# Patient Record
Sex: Female | Born: 1945 | Race: White | Hispanic: No | Smoking: Never smoker
Health system: Southern US, Community
[De-identification: ages and names within clinical notes are randomized; demographics above are authoritative.]

## PROBLEM LIST (undated history)

## (undated) DIAGNOSIS — G5600 Carpal tunnel syndrome, unspecified upper limb: Secondary | ICD-10-CM

## (undated) DIAGNOSIS — F419 Anxiety disorder, unspecified: Secondary | ICD-10-CM

## (undated) DIAGNOSIS — K449 Diaphragmatic hernia without obstruction or gangrene: Secondary | ICD-10-CM

## (undated) DIAGNOSIS — M79604 Pain in right leg: Secondary | ICD-10-CM

## (undated) DIAGNOSIS — K224 Dyskinesia of esophagus: Secondary | ICD-10-CM

## (undated) DIAGNOSIS — I10 Essential (primary) hypertension: Secondary | ICD-10-CM

## (undated) HISTORY — DX: Pain in right leg: M79.604

## (undated) HISTORY — DX: Diaphragmatic hernia without obstruction or gangrene: K44.9

## (undated) HISTORY — DX: Essential (primary) hypertension: I10

## (undated) HISTORY — DX: Anxiety disorder, unspecified: F41.9

## (undated) HISTORY — DX: Dyskinesia of esophagus: K22.4

## (undated) HISTORY — PX: CARPAL TUNNEL RELEASE: SHX101

## (undated) HISTORY — PX: HERNIA REPAIR: SHX51

## (undated) HISTORY — DX: Carpal tunnel syndrome, unspecified upper limb: G56.00

## (undated) HISTORY — PX: ABDOMINAL HYSTERECTOMY: SHX81

---

## 2017-11-04 DIAGNOSIS — I10 Essential (primary) hypertension: Secondary | ICD-10-CM | POA: Diagnosis not present

## 2017-11-04 DIAGNOSIS — L820 Inflamed seborrheic keratosis: Secondary | ICD-10-CM | POA: Diagnosis not present

## 2017-11-04 DIAGNOSIS — R946 Abnormal results of thyroid function studies: Secondary | ICD-10-CM | POA: Diagnosis not present

## 2017-12-14 DIAGNOSIS — Z1231 Encounter for screening mammogram for malignant neoplasm of breast: Secondary | ICD-10-CM | POA: Diagnosis not present

## 2018-03-09 DIAGNOSIS — R109 Unspecified abdominal pain: Secondary | ICD-10-CM | POA: Diagnosis not present

## 2018-03-09 DIAGNOSIS — R195 Other fecal abnormalities: Secondary | ICD-10-CM | POA: Diagnosis not present

## 2018-03-09 DIAGNOSIS — Z23 Encounter for immunization: Secondary | ICD-10-CM | POA: Diagnosis not present

## 2018-03-09 DIAGNOSIS — I10 Essential (primary) hypertension: Secondary | ICD-10-CM | POA: Diagnosis not present

## 2018-03-10 DIAGNOSIS — R195 Other fecal abnormalities: Secondary | ICD-10-CM | POA: Diagnosis not present

## 2018-03-11 DIAGNOSIS — R1032 Left lower quadrant pain: Secondary | ICD-10-CM | POA: Diagnosis not present

## 2018-03-22 DIAGNOSIS — J029 Acute pharyngitis, unspecified: Secondary | ICD-10-CM | POA: Diagnosis not present

## 2018-03-23 DIAGNOSIS — I10 Essential (primary) hypertension: Secondary | ICD-10-CM | POA: Diagnosis not present

## 2018-03-23 DIAGNOSIS — K5730 Diverticulosis of large intestine without perforation or abscess without bleeding: Secondary | ICD-10-CM | POA: Diagnosis not present

## 2018-03-23 DIAGNOSIS — J029 Acute pharyngitis, unspecified: Secondary | ICD-10-CM | POA: Diagnosis not present

## 2018-03-23 DIAGNOSIS — R6889 Other general symptoms and signs: Secondary | ICD-10-CM | POA: Diagnosis not present

## 2018-04-12 DIAGNOSIS — J069 Acute upper respiratory infection, unspecified: Secondary | ICD-10-CM | POA: Diagnosis not present

## 2018-06-03 DIAGNOSIS — S61211A Laceration without foreign body of left index finger without damage to nail, initial encounter: Secondary | ICD-10-CM | POA: Diagnosis not present

## 2019-01-18 DIAGNOSIS — Z13820 Encounter for screening for osteoporosis: Secondary | ICD-10-CM | POA: Diagnosis not present

## 2019-01-18 DIAGNOSIS — Z0000 Encounter for general adult medical examination without abnormal findings: Secondary | ICD-10-CM | POA: Diagnosis not present

## 2019-02-27 DIAGNOSIS — Z13820 Encounter for screening for osteoporosis: Secondary | ICD-10-CM | POA: Diagnosis not present

## 2019-02-27 DIAGNOSIS — Z1231 Encounter for screening mammogram for malignant neoplasm of breast: Secondary | ICD-10-CM | POA: Diagnosis not present

## 2019-04-28 DIAGNOSIS — R0981 Nasal congestion: Secondary | ICD-10-CM | POA: Diagnosis not present

## 2019-04-28 DIAGNOSIS — J029 Acute pharyngitis, unspecified: Secondary | ICD-10-CM | POA: Diagnosis not present

## 2019-04-28 DIAGNOSIS — Z20822 Contact with and (suspected) exposure to covid-19: Secondary | ICD-10-CM | POA: Diagnosis not present

## 2019-04-28 DIAGNOSIS — R519 Headache, unspecified: Secondary | ICD-10-CM | POA: Diagnosis not present

## 2019-05-25 DIAGNOSIS — Z20828 Contact with and (suspected) exposure to other viral communicable diseases: Secondary | ICD-10-CM | POA: Diagnosis not present

## 2019-05-25 DIAGNOSIS — Z03818 Encounter for observation for suspected exposure to other biological agents ruled out: Secondary | ICD-10-CM | POA: Diagnosis not present

## 2019-06-27 DIAGNOSIS — Z03818 Encounter for observation for suspected exposure to other biological agents ruled out: Secondary | ICD-10-CM | POA: Diagnosis not present

## 2019-06-27 DIAGNOSIS — Z20828 Contact with and (suspected) exposure to other viral communicable diseases: Secondary | ICD-10-CM | POA: Diagnosis not present

## 2019-07-03 DIAGNOSIS — M9904 Segmental and somatic dysfunction of sacral region: Secondary | ICD-10-CM | POA: Diagnosis not present

## 2019-07-03 DIAGNOSIS — M5442 Lumbago with sciatica, left side: Secondary | ICD-10-CM | POA: Diagnosis not present

## 2019-07-03 DIAGNOSIS — M9903 Segmental and somatic dysfunction of lumbar region: Secondary | ICD-10-CM | POA: Diagnosis not present

## 2019-07-03 DIAGNOSIS — M9902 Segmental and somatic dysfunction of thoracic region: Secondary | ICD-10-CM | POA: Diagnosis not present

## 2019-07-04 DIAGNOSIS — M9902 Segmental and somatic dysfunction of thoracic region: Secondary | ICD-10-CM | POA: Diagnosis not present

## 2019-07-04 DIAGNOSIS — M5442 Lumbago with sciatica, left side: Secondary | ICD-10-CM | POA: Diagnosis not present

## 2019-07-04 DIAGNOSIS — M9903 Segmental and somatic dysfunction of lumbar region: Secondary | ICD-10-CM | POA: Diagnosis not present

## 2019-07-04 DIAGNOSIS — M9904 Segmental and somatic dysfunction of sacral region: Secondary | ICD-10-CM | POA: Diagnosis not present

## 2019-07-06 DIAGNOSIS — M5442 Lumbago with sciatica, left side: Secondary | ICD-10-CM | POA: Diagnosis not present

## 2019-07-06 DIAGNOSIS — M9902 Segmental and somatic dysfunction of thoracic region: Secondary | ICD-10-CM | POA: Diagnosis not present

## 2019-07-06 DIAGNOSIS — M9903 Segmental and somatic dysfunction of lumbar region: Secondary | ICD-10-CM | POA: Diagnosis not present

## 2019-07-06 DIAGNOSIS — M9904 Segmental and somatic dysfunction of sacral region: Secondary | ICD-10-CM | POA: Diagnosis not present

## 2019-07-12 DIAGNOSIS — M5442 Lumbago with sciatica, left side: Secondary | ICD-10-CM | POA: Diagnosis not present

## 2019-07-12 DIAGNOSIS — M9903 Segmental and somatic dysfunction of lumbar region: Secondary | ICD-10-CM | POA: Diagnosis not present

## 2019-07-12 DIAGNOSIS — M9902 Segmental and somatic dysfunction of thoracic region: Secondary | ICD-10-CM | POA: Diagnosis not present

## 2019-07-12 DIAGNOSIS — M9904 Segmental and somatic dysfunction of sacral region: Secondary | ICD-10-CM | POA: Diagnosis not present

## 2019-07-21 DIAGNOSIS — M9903 Segmental and somatic dysfunction of lumbar region: Secondary | ICD-10-CM | POA: Diagnosis not present

## 2019-07-21 DIAGNOSIS — M9904 Segmental and somatic dysfunction of sacral region: Secondary | ICD-10-CM | POA: Diagnosis not present

## 2019-07-21 DIAGNOSIS — M9902 Segmental and somatic dysfunction of thoracic region: Secondary | ICD-10-CM | POA: Diagnosis not present

## 2019-07-21 DIAGNOSIS — M5442 Lumbago with sciatica, left side: Secondary | ICD-10-CM | POA: Diagnosis not present

## 2019-07-26 DIAGNOSIS — M9904 Segmental and somatic dysfunction of sacral region: Secondary | ICD-10-CM | POA: Diagnosis not present

## 2019-07-26 DIAGNOSIS — M9902 Segmental and somatic dysfunction of thoracic region: Secondary | ICD-10-CM | POA: Diagnosis not present

## 2019-07-26 DIAGNOSIS — M9903 Segmental and somatic dysfunction of lumbar region: Secondary | ICD-10-CM | POA: Diagnosis not present

## 2019-07-26 DIAGNOSIS — M5442 Lumbago with sciatica, left side: Secondary | ICD-10-CM | POA: Diagnosis not present

## 2019-07-28 DIAGNOSIS — S99921A Unspecified injury of right foot, initial encounter: Secondary | ICD-10-CM | POA: Diagnosis not present

## 2019-07-28 DIAGNOSIS — M25561 Pain in right knee: Secondary | ICD-10-CM | POA: Diagnosis not present

## 2019-07-28 DIAGNOSIS — M25571 Pain in right ankle and joints of right foot: Secondary | ICD-10-CM | POA: Diagnosis not present

## 2019-07-28 DIAGNOSIS — M79661 Pain in right lower leg: Secondary | ICD-10-CM | POA: Diagnosis not present

## 2019-07-28 DIAGNOSIS — S82831A Other fracture of upper and lower end of right fibula, initial encounter for closed fracture: Secondary | ICD-10-CM | POA: Diagnosis not present

## 2019-07-28 DIAGNOSIS — S93401A Sprain of unspecified ligament of right ankle, initial encounter: Secondary | ICD-10-CM | POA: Diagnosis not present

## 2019-07-28 DIAGNOSIS — S99911A Unspecified injury of right ankle, initial encounter: Secondary | ICD-10-CM | POA: Diagnosis not present

## 2019-08-03 DIAGNOSIS — M25571 Pain in right ankle and joints of right foot: Secondary | ICD-10-CM | POA: Diagnosis not present

## 2019-08-03 DIAGNOSIS — M79604 Pain in right leg: Secondary | ICD-10-CM | POA: Diagnosis not present

## 2019-08-17 DIAGNOSIS — M25571 Pain in right ankle and joints of right foot: Secondary | ICD-10-CM | POA: Diagnosis not present

## 2019-08-17 DIAGNOSIS — M25551 Pain in right hip: Secondary | ICD-10-CM | POA: Diagnosis not present

## 2019-08-31 DIAGNOSIS — M25571 Pain in right ankle and joints of right foot: Secondary | ICD-10-CM | POA: Diagnosis not present

## 2019-08-31 DIAGNOSIS — M79604 Pain in right leg: Secondary | ICD-10-CM | POA: Diagnosis not present

## 2019-09-01 ENCOUNTER — Ambulatory Visit: Payer: BC Managed Care – PPO | Attending: Sports Medicine | Admitting: Physical Therapy

## 2019-09-01 ENCOUNTER — Encounter: Payer: Self-pay | Admitting: Physical Therapy

## 2019-09-01 ENCOUNTER — Other Ambulatory Visit: Payer: Self-pay

## 2019-09-01 DIAGNOSIS — M5417 Radiculopathy, lumbosacral region: Secondary | ICD-10-CM | POA: Diagnosis not present

## 2019-09-01 DIAGNOSIS — M6281 Muscle weakness (generalized): Secondary | ICD-10-CM | POA: Diagnosis not present

## 2019-09-01 DIAGNOSIS — M79604 Pain in right leg: Secondary | ICD-10-CM

## 2019-09-01 NOTE — Therapy (Signed)
[facility] [address] [place], [place], [address] Phone: [phone]   Fax:  [phone]  Physical Therapy Evaluation  Patient Details  Name: Katrina Johnson MRN: [id number] Date of Birth: 12/12/1945 Referring Provider (PT): Rodolph Bong, MD   Encounter Date: 09/01/2019   PT End of Session - 09/01/19 1241    Visit Number 1    Number of Visits 8    Date for PT Re-Evaluation 10/06/19    Authorization Type Progress note every 10th visit    PT Start Time 1046   late arrival   PT Stop Time 1115    PT Time Calculation (min) 29 min    Activity Tolerance Patient limited by pain    Behavior During Therapy [facility] for tasks assessed/performed           History reviewed. No pertinent past medical history.  History reviewed. No pertinent surgical history.  There were no vitals filed for this visit.    Subjective Assessment - 09/01/19 1232    Subjective COVID-19 screening performed upon arrival.Patient arrives to physical therapy with reports of right LE pain, neurological symptoms of burning and numbness to right foot/toes, and difficulty walking that began about May 2021. Patient reports sustaining a broken right ankle in June 2021 in which she has just weaned out of the CAM boot. Patient reports sitting increases her pain. Due to pain, numbness, tingling, burning sensation, walking and standing has been difficult and she has fallen at least 5 times in the past 6 months. Patient reports ability to perform basic ADLs but with pain. Pain at worst is rated at 10/10 and pain at best as 0/10. Patient's goals are to decrease pain, improve movement, improve strength, improve ability to perform ADLs and work activities, and feel more stable with walking.    Pertinent History HTN    Limitations Sitting;Lifting;Standing;House hold activities    How long can you sit comfortably? short periods    How long can you stand comfortably? short periods    How long  can you walk comfortably? short periods    Diagnostic tests x-ray & MRI    Patient Stated Goals "more stable walking"    Currently in Pain? Yes    Pain Score 8     Pain Location Leg    Pain Orientation Right    Pain Descriptors / Indicators Burning    Pain Type Chronic pain    Pain Radiating Towards R glute to R foot    Pain Onset More than a month ago    Pain Frequency Intermittent    Aggravating Factors  sitting, walking, standing    Pain Relieving Factors Gabapentin    Effect of Pain on Daily Activities pain with ADLs, sitting, and walking              [facility] PT Assessment - 09/01/19 0001      Assessment   Medical Diagnosis Pain in right leg    Referring Provider (PT) Rodolph Bong, MD    Onset Date/Surgical Date --   May 2021   Next MD Visit to be scheduled    Prior Therapy no      Precautions   Precautions Fall      Restrictions   Weight Bearing Restrictions No      Balance Screen   Has the patient fallen in the past 6 months Yes    How many times? 5    Has the patient had a decrease in activity level because  of a fear of falling?  Yes    Is the patient reluctant to leave their home because of a fear of falling?  No      Home Environment   Living Environment Private residence    Living Arrangements Other relatives    Home Access Stairs to enter    Entrance Stairs-Number of Steps 3    Entrance Stairs-Rails Can reach both      Prior Function   Level of Independence Independent with basic ADLs      Posture/Postural Control   Posture/Postural Control Postural limitations    Postural Limitations Rounded Shoulders;Forward head;Increased lumbar lordosis;Increased thoracic kyphosis;Flexed trunk;Weight shift left      Deep Tendon Reflexes   DTR Assessment Site Patella;Achilles    Patella DTR 2+   2+ bilaterall   Achilles DTR 1+   2+ left     ROM / Strength   AROM / PROM / Strength AROM;Strength      AROM   AROM Assessment Site Lumbar    Lumbar Flexion 5"  finger tip to floor    Lumbar - Right Side Bend 18" fingertip to floor    Lumbar - Left Side Bend 18" fingertip to floor      Strength   Strength Assessment Site Hip;Knee    Right/Left Hip Right;Left    Right Hip Flexion 4-/5    Right Hip Extension 2+/5    Right Hip ABduction 3/5    Left Hip Flexion 4-/5    Left Hip Extension 3+/5    Left Hip ABduction 3+/5    Right/Left Knee Right;Left    Right Knee Flexion 3+/5    Right Knee Extension 4-/5    Left Knee Flexion 4/5    Left Knee Extension 4+/5      Palpation   Palpation comment very tender to R lumbar paraspinals, QL, glutes, down to calf with reports of burning & tenderness      Special Tests    Special Tests Lumbar    Lumbar Tests Slump Test;Straight Leg Raise      Slump test   Findings Positive    Side Right      Straight Leg Raise   Findings Positive    Side  Right      Transfers   Comments use of momentum to come to sitting from supine      Ambulation/Gait   Gait Pattern Step-through pattern;Right steppage;Decreased step length - left;Decreased stance time - right;Decreased stride length;Decreased hip/knee flexion - right;Antalgic;Wide base of support;Decreased weight shift to right   R foot drop into inversion                     Objective measurements completed on examination: See above findings.                 PT Short Term Goals - 09/01/19 1242      PT SHORT TERM GOAL #1   Title STG=LTG             PT Long Term Goals - 09/01/19 1242      PT LONG TERM GOAL #1   Title Patient will be independent with HEP    Time 4    Period Weeks    Status New      PT LONG TERM GOAL #2   Title Patient will report ability to perform ADLs and work activities with right leg pain less than or equal to 4/10.    Time  4    Period Weeks    Status New      PT LONG TERM GOAL #3   Title Patient will report ability to walk community distances with right leg pain less than or equal to 4/10.     Time 4    Period Weeks    Status New      PT LONG TERM GOAL #4   Title Patient will report a centralization of neurological symptoms to knee or higher to denote decrease nerve irritation.    Time 4    Period Weeks    Status New                  Plan - 09/01/19 1311    Clinical Impression Statement Patient is a 74 year old female who presents to physical therapy with acute low back and right LE pain, decreased right LE MMT, and difficulty walking that began in May 2021. R Achilles DTR diminished. Patient very tender in right low back and glute musculature, down to calf upon palpation. (+) R slump test and (+) SLR with reproduction of symptoms. Patient ambulates with a step through pattern but with notable R foot drop into inversion during swing. Patient and PT discussed plan of care and discussed establishing HEP next visit due to late arrival. Patient would benefit from skilled physical therapy to address deficits and goals.    Personal Factors and Comorbidities Comorbidity 1    Comorbidities HTN    Examination-Activity Limitations Locomotion Level;Transfers;Stand;Stairs;Sit    Examination-Participation Restrictions Driving    Stability/Clinical Decision Making Evolving/Moderate complexity    Clinical Decision Making Moderate    Rehab Potential Fair    PT Frequency 2x / week    PT Duration 4 weeks    PT Treatment/Interventions ADLs/Self Care Home Management;Cryotherapy;Electrical Stimulation;Gait training;Stair training;Functional mobility training;Therapeutic activities;Therapeutic exercise;Balance training;Neuromuscular re-education;Manual techniques;Passive range of motion;Patient/family education;Ultrasound    PT Next Visit Plan nustep, gait training, LE strengthening, nerve glides; STW/M to low back glutes to decrease pain, modalties PRN for pain relief    PT Home Exercise Plan to be established next visit    Consulted and Agree with Plan of Care Patient           Patient  will benefit from skilled therapeutic intervention in order to improve the following deficits and impairments:  Abnormal gait, Decreased range of motion, Difficulty walking, Pain, Postural dysfunction, Decreased strength, Decreased balance, Decreased activity tolerance, Decreased mobility  Visit Diagnosis: Pain in right leg - Plan: PT plan of care cert/re-cert  Muscle weakness (generalized) - Plan: PT plan of care cert/re-cert  Radiculopathy, lumbosacral region - Plan: PT plan of care cert/re-cert     Problem List There are no problems to display for this patient.   Guss Bunde, PT, DPT 09/01/2019, 1:18 PM  [facility] [address] [place], [place], [address] Phone: [phone]   Fax:  [phone]  Name: Katrina Johnson MRN: [id number] Date of Birth: November 30, 1945

## 2019-09-05 ENCOUNTER — Encounter: Payer: Self-pay | Admitting: Physical Therapy

## 2019-09-05 ENCOUNTER — Other Ambulatory Visit: Payer: Self-pay

## 2019-09-05 ENCOUNTER — Ambulatory Visit: Payer: BC Managed Care – PPO | Admitting: Physical Therapy

## 2019-09-05 DIAGNOSIS — M5417 Radiculopathy, lumbosacral region: Secondary | ICD-10-CM

## 2019-09-05 DIAGNOSIS — M79604 Pain in right leg: Secondary | ICD-10-CM

## 2019-09-05 DIAGNOSIS — M6281 Muscle weakness (generalized): Secondary | ICD-10-CM

## 2019-09-05 NOTE — Therapy (Signed)
[facility] [address] [place], [place], [address] Phone: [phone]   Fax:  [phone]  Physical Therapy Treatment  Patient Details  Name: Katrina Johnson MRN: [id number] Date of Birth: 08/16/1945 Referring Provider (PT): Rodolph Bong, MD   Encounter Date: 09/05/2019   PT End of Session - 09/05/19 0958    Visit Number 2    Number of Visits 8    Date for PT Re-Evaluation 10/06/19    Authorization Type Progress note every 10th visit    PT Start Time 0945    PT Stop Time 1030    PT Time Calculation (min) 45 min    Activity Tolerance Patient limited by pain    Behavior During Therapy [facility] for tasks assessed/performed           History reviewed. No pertinent past medical history.  History reviewed. No pertinent surgical history.  There were no vitals filed for this visit.   Subjective Assessment - 09/05/19 0955    Subjective Covid-19 screen performed upon arrival. Pt reporting 8/10 pain in R LE with burning and numbness sensations. Pt reporting difficulty sleeping at night and unable to go back to sleep.    Pertinent History HTN    Limitations Sitting;Lifting;Standing;House hold activities    How long can you sit comfortably? short periods    How long can you stand comfortably? short periods    How long can you walk comfortably? short periods    Diagnostic tests x-ray & MRI    Patient Stated Goals "more stable walking"    Currently in Pain? Yes    Pain Score 8     Pain Location Leg    Pain Orientation Right    Pain Descriptors / Indicators Aching;Burning;Numbness    Pain Type Chronic pain    Pain Onset More than a month ago    Pain Frequency Intermittent                             [facility] Adult PT Treatment/Exercise - 09/05/19 0001      Exercises   Exercises Lumbar      Lumbar Exercises: Stretches   Active Hamstring Stretch Right;2 reps;30 seconds    Single Knee to Chest Stretch 2 reps;30 seconds       Lumbar Exercises: Supine   Ab Set 10 reps;3 seconds    Clam 10 reps;3 seconds;Limitations    Straight Leg Raise 10 reps;2 seconds    Other Supine Lumbar Exercises supine marching with core activation      Modalities   Modalities Electrical Stimulation      Electrical Stimulation   Electrical Stimulation Location lumbar spine    Electrical Stimulation Action IFC    Electrical Stimulation Parameters 80-150 Hz x 10 minutes    Electrical Stimulation Goals Pain      Manual Therapy   Manual therapy comments R LE long axis distraction x 3 holding 30 seconds each                  PT Education - 09/05/19 1032    Education Details PT POC, HEP    Person(s) Educated Patient    Methods Explanation;Demonstration;Handout    Comprehension Verbalized understanding;Returned demonstration            PT Short Term Goals - 09/01/19 1242      PT SHORT TERM GOAL #1   Title STG=LTG  PT Long Term Goals - 09/05/19 1001      PT LONG TERM GOAL #1   Title Patient will be independent with HEP    Time 4    Period Weeks    Status On-going      PT LONG TERM GOAL #2   Title Patient will report ability to perform ADLs and work activities with right leg pain less than or equal to 4/10.    Status On-going      PT LONG TERM GOAL #3   Title Patient will report ability to walk community distances with right leg pain less than or equal to 4/10.    Status On-going      PT LONG TERM GOAL #4   Title Patient will report a centralization of neurological symptoms to knee or higher to denote decrease nerve irritation.    Status On-going                 Plan - 09/05/19 1013    Clinical Impression Statement Pt tolerating exericses well. Pt reporting 8/10 pain down R LE. Pt with episodes of shooting pains into R foot/toes during session. Pt was instructed in HEP and introduced to extension exercises where pt reporting no change to symptoms. Continue to progress amb, we  discussed extension exericse progression to moniter response as well as manual and mechanical traction. Continue skilled PT.    Personal Factors and Comorbidities Comorbidity 1    Comorbidities HTN    Examination-Activity Limitations Locomotion Level;Transfers;Stand;Stairs;Sit    Examination-Participation Restrictions Driving    Stability/Clinical Decision Making Evolving/Moderate complexity    Rehab Potential Fair    PT Frequency 2x / week    PT Duration 4 weeks    PT Treatment/Interventions ADLs/Self Care Home Management;Cryotherapy;Electrical Stimulation;Gait training;Stair training;Functional mobility training;Therapeutic activities;Therapeutic exercise;Balance training;Neuromuscular re-education;Manual techniques;Passive range of motion;Patient/family education;Ultrasound    PT Next Visit Plan nustep, gait training, LE strengthening, nerve glides; STW/M to low back glutes to decrease pain, modalties PRN for pain relief    PT Home Exercise Plan to be established next visit    Consulted and Agree with Plan of Care Patient           Patient will benefit from skilled therapeutic intervention in order to improve the following deficits and impairments:  Abnormal gait, Decreased range of motion, Difficulty walking, Pain, Postural dysfunction, Decreased strength, Decreased balance, Decreased activity tolerance, Decreased mobility  Visit Diagnosis: Pain in right leg  Muscle weakness (generalized)  Radiculopathy, lumbosacral region     Problem List There are no problems to display for this patient.   Sharmon Leyden PT, MPT 09/05/2019, 10:32 AM  [facility] [address] [place], [place], [address] Phone: [phone]   Fax:  [phone]  Name: Katrina Johnson MRN: [id number] Date of Birth: 24-Jun-1945

## 2019-09-05 NOTE — Patient Instructions (Signed)
Access Code: [email or web address] URL: [email or web address] Date: 09/05/2019 Prepared by: Narda Amber  Exercises Hooklying Hamstring Stretch with Strap - 2 x daily - 7 x weekly - 3-5 reps - 30 seconds hold Hooklying Single Knee to Chest Stretch - 2 x daily - 7 x weekly - 3-5 reps - 30 seconds hold Seated Thoracic Lumbar Extension - 2 x daily - 7 x weekly - 5 reps - 5-10 seconds hold Supine March - 2 x daily - 7 x weekly - 10 reps - 2 seconds hold

## 2019-09-06 ENCOUNTER — Ambulatory Visit: Payer: BC Managed Care – PPO | Admitting: Physical Therapy

## 2019-09-07 ENCOUNTER — Other Ambulatory Visit: Payer: Self-pay

## 2019-09-07 ENCOUNTER — Ambulatory Visit: Payer: BC Managed Care – PPO | Admitting: Physical Therapy

## 2019-09-07 DIAGNOSIS — M5417 Radiculopathy, lumbosacral region: Secondary | ICD-10-CM | POA: Diagnosis not present

## 2019-09-07 DIAGNOSIS — M79604 Pain in right leg: Secondary | ICD-10-CM

## 2019-09-07 DIAGNOSIS — M6281 Muscle weakness (generalized): Secondary | ICD-10-CM | POA: Diagnosis not present

## 2019-09-07 NOTE — Therapy (Addendum)
[facility] [address] [place], [place], [address] Phone: [phone]   Fax:  [phone]  Physical Therapy Treatment PHYSICAL THERAPY DISCHARGE SUMMARY  Visits from Start of Care: 3  Current functional level related to goals / functional outcomes: See below   Remaining deficits: See goals   Education / Equipment: HEP Plan: Patient agrees to discharge.  Patient goals were not met. Patient is being discharged due to not returning since the last visit.  ?????  Guss Bunde, PT, DPT 04/12/20   Patient Details  Name: Katrina Johnson MRN: [id number] Date of Birth: 1945/04/08 Referring Provider (PT): Rodolph Bong, MD   Encounter Date: 09/07/2019   PT End of Session - 09/07/19 0948    Visit Number 3    Number of Visits 8    Date for PT Re-Evaluation 10/06/19    Authorization Type Progress note every 10th visit    PT Start Time 0945    PT Stop Time 1028    PT Time Calculation (min) 43 min    Activity Tolerance Patient limited by pain    Behavior During Therapy [facility] for tasks assessed/performed           No past medical history on file.  No past surgical history on file.  There were no vitals filed for this visit.   Subjective Assessment - 09/07/19 0948    Subjective Covid-19 screen performed upon arrival. Pt reporting 8/10 pain in R LE with burning and numbness sensations. Pt reporting difficulty sleeping at night and unable to go back to sleep.    Pertinent History HTN    Limitations Sitting;Lifting;Standing;House hold activities    How long can you sit comfortably? short periods    How long can you stand comfortably? short periods    How long can you walk comfortably? short periods    Diagnostic tests x-ray & MRI    Patient Stated Goals "more stable walking"    Currently in Pain? Yes    Pain Score 3     Pain Location Leg    Pain Orientation Right    Pain Descriptors / Indicators Aching;Burning;Numbness    Pain  Type Chronic pain    Pain Onset More than a month ago    Pain Frequency Intermittent              [facility] PT Assessment - 09/07/19 0001      Assessment   Medical Diagnosis Pain in right leg    Referring Provider (PT) Rodolph Bong, MD    Next MD Visit to be scheduled    Prior Therapy no      Precautions   Precautions [facility] Adult PT Treatment/Exercise - 09/07/19 0001      Exercises   Exercises Lumbar      Lumbar Exercises: Stretches   Single Knee to Chest Stretch 3 reps;30 seconds    Figure 4 Stretch 3 reps;30 seconds;Supine;Without overpressure      Lumbar Exercises: Aerobic   Nustep level 3 x10 mins UE and LE monitored      Lumbar Exercises: Supine   Ab Set 15 reps;3 seconds    Glut Set 15 reps;3 seconds    Clam 3 seconds;10 reps   x10   Clam Limitations yellow theraband     Straight Leg Raise 10 reps;2 seconds    Other Supine Lumbar  Exercises supine marching with core activation x10 3" hold yellow theraband      Lumbar Exercises: Sidelying   Clam Right;15 reps;2 seconds      Modalities   Modalities Electrical Stimulation      Electrical Stimulation   Electrical Stimulation Location lumbar spine    Electrical Stimulation Action IFC    Electrical Stimulation Parameters 80-150 hz x10 mins    Electrical Stimulation Goals Pain                    PT Short Term Goals - 09/01/19 1242      PT SHORT TERM GOAL #1   Title STG=LTG             PT Long Term Goals - 09/05/19 1001      PT LONG TERM GOAL #1   Title Patient will be independent with HEP    Time 4    Period Weeks    Status On-going      PT LONG TERM GOAL #2   Title Patient will report ability to perform ADLs and work activities with right leg pain less than or equal to 4/10.    Status On-going      PT LONG TERM GOAL #3   Title Patient will report ability to walk community distances with right leg pain less than or equal to 4/10.    Status  On-going      PT LONG TERM GOAL #4   Title Patient will report a centralization of neurological symptoms to knee or higher to denote decrease nerve irritation.    Status On-going                 Plan - 09/07/19 1003    Clinical Impression Statement Patient responded fairly to therapy session with reports of pain in R LE. Upon standing from nustep, patient reported sharp shooting pain in R LE down to her toes. Patient with intermittent reports of pain throughout TEs; rest breaks provided to which pain decreased. Normal response upon removal of modalities.    Personal Factors and Comorbidities Comorbidity 1    Comorbidities HTN    Examination-Activity Limitations Locomotion Level;Transfers;Stand;Stairs;Sit    Examination-Participation Restrictions Driving    Stability/Clinical Decision Making Evolving/Moderate complexity    Clinical Decision Making Moderate    Rehab Potential Fair    PT Frequency 2x / week    PT Duration 4 weeks    PT Treatment/Interventions ADLs/Self Care Home Management;Cryotherapy;Electrical Stimulation;Gait training;Stair training;Functional mobility training;Therapeutic activities;Therapeutic exercise;Balance training;Neuromuscular re-education;Manual techniques;Passive range of motion;Patient/family education;Ultrasound    PT Next Visit Plan nustep, gait training, LE strengthening, nerve glides; STW/M to low back glutes to decrease pain, modalties PRN for pain relief    PT Home Exercise Plan see patient education section    Consulted and Agree with Plan of Care Patient           Patient will benefit from skilled therapeutic intervention in order to improve the following deficits and impairments:  Abnormal gait, Decreased range of motion, Difficulty walking, Pain, Postural dysfunction, Decreased strength, Decreased balance, Decreased activity tolerance, Decreased mobility  Visit Diagnosis: Pain in right leg  Muscle weakness (generalized)  Radiculopathy,  lumbosacral region     Problem List There are no problems to display for this patient.   Gabriela Eves, PT, DPT 09/07/2019, 11:32 AM  [facility] [address] [place], [place], [address] Phone: [phone]   Fax:  [phone]  Name: Katrina Johnson MRN:  [id number] Date of Birth: [birth year]

## 2019-09-11 DIAGNOSIS — M79671 Pain in right foot: Secondary | ICD-10-CM | POA: Diagnosis not present

## 2019-09-11 DIAGNOSIS — M21371 Foot drop, right foot: Secondary | ICD-10-CM | POA: Diagnosis not present

## 2019-09-11 DIAGNOSIS — M25571 Pain in right ankle and joints of right foot: Secondary | ICD-10-CM | POA: Diagnosis not present

## 2019-09-12 ENCOUNTER — Ambulatory Visit: Payer: BC Managed Care – PPO | Admitting: *Deleted

## 2019-09-14 ENCOUNTER — Ambulatory Visit: Payer: BC Managed Care – PPO | Admitting: Physical Therapy

## 2019-09-15 DIAGNOSIS — R531 Weakness: Secondary | ICD-10-CM | POA: Diagnosis not present

## 2019-09-15 DIAGNOSIS — M25551 Pain in right hip: Secondary | ICD-10-CM | POA: Diagnosis not present

## 2019-09-15 DIAGNOSIS — M5431 Sciatica, right side: Secondary | ICD-10-CM | POA: Diagnosis not present

## 2019-09-15 DIAGNOSIS — Z79899 Other long term (current) drug therapy: Secondary | ICD-10-CM | POA: Diagnosis not present

## 2019-09-15 DIAGNOSIS — Z883 Allergy status to other anti-infective agents status: Secondary | ICD-10-CM | POA: Diagnosis not present

## 2019-09-19 ENCOUNTER — Encounter: Payer: BC Managed Care – PPO | Admitting: Physical Therapy

## 2019-09-21 ENCOUNTER — Encounter: Payer: BC Managed Care – PPO | Admitting: Physical Therapy

## 2019-09-25 DIAGNOSIS — M5416 Radiculopathy, lumbar region: Secondary | ICD-10-CM | POA: Diagnosis not present

## 2019-09-26 ENCOUNTER — Encounter: Payer: Self-pay | Admitting: Neurology

## 2019-09-26 ENCOUNTER — Ambulatory Visit: Payer: BC Managed Care – PPO | Admitting: Neurology

## 2019-09-26 ENCOUNTER — Other Ambulatory Visit: Payer: Self-pay

## 2019-09-26 VITALS — BP 136/84 | HR 85 | Ht 62.0 in | Wt 175.0 lb

## 2019-09-26 DIAGNOSIS — M21371 Foot drop, right foot: Secondary | ICD-10-CM | POA: Diagnosis not present

## 2019-09-26 DIAGNOSIS — M5431 Sciatica, right side: Secondary | ICD-10-CM

## 2019-09-26 HISTORY — DX: Foot drop, right foot: M21.371

## 2019-09-26 NOTE — Progress Notes (Signed)
Reason for visit: Right-sided sciatica  Referring physician: Dr. Star Age Want is a 74 y.o. female  History of present illness:  Katrina Johnson is a 74 year old right-handed white female with a history of onset of right-sided sciatica that began in May 2021.  The patient had made a long car drive from [place] to the [place] area, 6 hours in the car.  The patient began having pain in the right buttock going down the right leg to the foot.  She does not recall any weakness initially.  She was seen by chiropractor for this issue.  In mid June 2021, she fell and fractured her right ankle, she did not initially know this and was walking on the foot for a couple weeks.  She was being seen by chiropractor around that time.  Eventually she went to [facility] after an x-ray showed evidence of the fracture of the fibula.  She was placed in a brace, but did not wear the brace properly for the first 2 weeks, when she took the brace off she clearly had evidence of weakness in the right leg and with a prominent foot drop.  The patient has developed significant spasms of the right leg and shooting pain going down the leg from the buttocks to the foot.  She went to the emergency room on 16 September 2019 and she has been placed on hydrocodone, gabapentin, methocarbamol, and she was given 2 courses of prednisone.  The prednisone did help some.  She indicates that she had MRI of the lumbar spine yesterday, but she does not know the results of the study.  The patient has a walker and a cane at home, she continues to fall on occasion.  She denies issues controlling the bladder but she does have some chronic issues with intermittent diarrhea and constipation.  She reports no neck pain or pain down the arms and no pain down the left leg or weakness of the left leg.  She is sent to this office for further evaluation.  Past Medical History:  Diagnosis Date  . Anxiety   . CTS (carpal tunnel syndrome)    right  .  Esophageal spasm   . Hiatal hernia   . Hypertension   . Right leg pain     Past Surgical History:  Procedure Laterality Date  . ABDOMINAL HYSTERECTOMY    . CARPAL TUNNEL RELEASE Right   . HERNIA REPAIR      Family History  Problem Relation Age of Onset  . Heart attack Mother   . Heart disease Father   . Bone cancer Father     Social history:  reports that she has never smoked. She has never used smokeless tobacco. She reports current alcohol use. She reports that she does not use drugs.  Medications:  Prior to Admission medications   Medication Sig Start Date End Date Taking? Authorizing Provider  atenolol (TENORMIN) 100 MG tablet Take 100 mg by mouth daily.   Yes [provider]  DULoxetine (CYMBALTA) 60 MG capsule Take 1 capsule by mouth daily.   Yes [provider]  gabapentin (NEURONTIN) 300 MG capsule Take 300 mg by mouth 3 (three) times daily as needed.   Yes [provider]  HYDROcodone-acetaminophen (NORCO/VICODIN) 5-325 MG tablet hydrocodone 5 mg-acetaminophen 325 mg tablet  Take 1 tablet every 4-6 hours by oral route as needed for pain for 5 days.   Yes [provider]  methocarbamol (ROBAXIN) 500 MG tablet Take 500  mg by mouth every 4 (four) hours as needed for muscle spasms.   Yes [provider]  quinapril (ACCUPRIL) 40 MG tablet Take 40 mg by mouth daily. 03/17/16  Yes [provider]      Allergies  Allergen Reactions  . Erythromycin Nausea And Vomiting    ROS:  Out of a complete 14 system review of symptoms, the patient complains only of the following symptoms, and all other reviewed systems are negative.  Right-sided sciatica, right leg weakness Walking difficulty, falls  Blood pressure 136/84, pulse 85, height 5\' 2"  (1.575 m), weight 175 lb (79.4 kg).  Physical Exam  General: The patient is alert and cooperative at the time of the examination.  The patient is moderately obese.  Eyes: Pupils are  equal, round, and reactive to light. Discs are flat bilaterally.  Neck: The neck is supple, no carotid bruits are noted.  Respiratory: The respiratory examination is clear.  Cardiovascular: The cardiovascular examination reveals a regular rate and rhythm, no obvious murmurs or rubs are noted.  Skin: Extremities are without significant edema.  Neurologic Exam  Mental status: The patient is alert and oriented x 3 at the time of the examination. The patient has apparent normal recent and remote memory, with an apparently normal attention span and concentration ability.  Cranial nerves: Facial symmetry is present. There is good sensation of the face to pinprick and soft touch bilaterally. The strength of the facial muscles and the muscles to head turning and shoulder shrug are normal bilaterally. Speech is well enunciated, no aphasia or dysarthria is noted. Extraocular movements are full. Visual fields are full. The tongue is midline, and the patient has symmetric elevation of the soft palate. No obvious hearing deficits are noted.  Motor: The motor testing reveals 5 over 5 strength of all 4 extremities, with exception of a prominent right foot drop and weakness with plantar flexion of the right foot and severe weakness with inversion and eversion of the right foot and weakness of toe flexion extension of the right foot. Good symmetric motor tone is noted throughout.  Sensory: Sensory testing is intact to pinprick, soft touch, vibration sensation, and position sense on all 4 extremities, with exception of decreased pinprick, vibration sensation, and position sensation of the right foot. No evidence of extinction is noted.  Coordination: Cerebellar testing reveals good finger-nose-finger and heel-to-shin bilaterally.  Gait and station: Gait is somewhat wide-based, prominent steppage gait pattern with the right leg is seen.  Tandem gait was not attempted.  Romberg is negative.  The patient is not  able to walk on the toes or heels on the right foot.  Reflexes: Deep tendon reflexes are symmetric and normal bilaterally, with exception of absence of the right ankle jerk reflex. Toes are downgoing bilaterally.   Assessment/Plan:  1.  Right-sided sciatica, right leg weakness  The patient apparently has already had MRI of the lumbar spine.  The results were not available to me at this time.  If the MRI reveals evidence of L5 and S1 nerve root compression, the patient likely will require surgical decompression.  If the MRI is unrevealing, EMG and nerve conduction study evaluation is more important.  The patient may have a sciatic neuropathy with or without an overlying peroneal neuropathy at the knee.  I will set the patient up for EMG and nerve conduction study, she will have nerve conductions on both legs and EMG on the right leg.  This can be canceled if she requires  lumbar surgery.  Marlan Palau MD 09/26/2019 9:08 AM  [facility] [address] [place], [place] [address]  Phone [phone] Fax [phone]

## 2019-09-29 DIAGNOSIS — M79604 Pain in right leg: Secondary | ICD-10-CM | POA: Diagnosis not present

## 2019-10-09 ENCOUNTER — Telehealth: Payer: Self-pay | Admitting: Neurology

## 2019-10-09 DIAGNOSIS — M7989 Other specified soft tissue disorders: Secondary | ICD-10-CM

## 2019-10-09 NOTE — Telephone Encounter (Signed)
I called the patient.  Within the last week she has had onset of swelling in the right foot and ankle and increased pain, she may have pain with weightbearing.  She is on gabapentin but has swelling on the right foot.  I will check a venous Doppler study, the patient will call her orthopedic doctor to make sure there was not a reinjury to the ankle or foot.

## 2019-10-09 NOTE — Addendum Note (Signed)
Addended by: York Spaniel on: 10/09/2019 04:17 PM   Modules accepted: Orders

## 2019-10-09 NOTE — Telephone Encounter (Signed)
Attempted to call pt, LVM for call back  °

## 2019-10-09 NOTE — Telephone Encounter (Signed)
Pt called feet swollen, having a lot of pain, leg cramping. Pt would like a call from the nurse.

## 2019-10-10 DIAGNOSIS — M79604 Pain in right leg: Secondary | ICD-10-CM | POA: Diagnosis not present

## 2019-10-10 DIAGNOSIS — M79671 Pain in right foot: Secondary | ICD-10-CM | POA: Diagnosis not present

## 2019-10-11 ENCOUNTER — Telehealth: Payer: Self-pay | Admitting: Neurology

## 2019-10-11 ENCOUNTER — Ambulatory Visit (INDEPENDENT_AMBULATORY_CARE_PROVIDER_SITE_OTHER): Payer: BC Managed Care – PPO | Admitting: Neurology

## 2019-10-11 ENCOUNTER — Ambulatory Visit: Payer: BC Managed Care – PPO | Admitting: Neurology

## 2019-10-11 ENCOUNTER — Other Ambulatory Visit: Payer: Self-pay

## 2019-10-11 ENCOUNTER — Encounter: Payer: Self-pay | Admitting: Neurology

## 2019-10-11 DIAGNOSIS — G541 Lumbosacral plexus disorders: Secondary | ICD-10-CM

## 2019-10-11 DIAGNOSIS — M5431 Sciatica, right side: Secondary | ICD-10-CM

## 2019-10-11 HISTORY — DX: Lumbosacral plexus disorders: G54.1

## 2019-10-11 NOTE — Telephone Encounter (Signed)
MRI of the lumbar spine was done through [facility], no clear evidence of L5 or S1 nerve root impingement.   MRI lumbar 09/25/19:  Impression:  1.  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  Mild to moderate narrowing of the subarticular zones and mild to moderate left neuroforaminal stenosis.  Intraspinous bursitis also present.  2.  L5-S1 moderate bilateral facet arthrosis with minimal disc bulge.

## 2019-10-11 NOTE — Progress Notes (Signed)
Please refer to EMG and nerve conduction procedure note.  

## 2019-10-11 NOTE — Procedures (Signed)
     HISTORY:  Katrina Johnson is a 74 year old patient with a history of onset of right foot drop with severe pain in the posterior regions of the thigh and in the leg below the knee.  The patient denies any back pain per se.  She has severe weakness of the leg, she has been evaluated for this issue.  NERVE CONDUCTION STUDIES:  Nerve conduction studies were performed on both lower extremities.  The distal motor latency and motor amplitude for the left peroneal nerve was normal but was absent on the right.  The distal motor latencies for the posterior tibial nerves were normal bilaterally but with a low motor amplitude on the right and normal on the left.  Slowing was seen for the right posterior tibial nerve, normal nerve conduction velocity was seen for the left posterior tibial nerve and left peroneal nerve.  The sural sensory latencies were within normal limits bilaterally but was unobtainable for the right peroneal nerve, normal for the left peroneal nerve.  The F-wave latency for the right posterior tibial nerve was unobtainable, normal for the left posterior tibial nerve.  EMG STUDIES:  EMG study was performed on the right lower extremity:  The tibialis anterior muscle reveals no voluntary motor units. 3+ fibrillations and positive waves were seen. The peroneus tertius muscle reveals no voluntary motor units. 3+ fibrillations and positive waves were seen. The medial gastrocnemius muscle reveals 1 to 3K motor units with slightly decreased recruitment. No fibrillations or positive waves were seen. The vastus lateralis muscle reveals 2 to 4K motor units with full recruitment. No fibrillations or positive waves were seen. The iliopsoas muscle reveals 2 to 4K motor units with full recruitment. No fibrillations or positive waves were seen. The biceps femoris muscle (long head) reveals 2 to 3K motor units with decreased recruitment. 2+ fibrillations and positive waves were seen. The gluteus medius  muscle reveals 1 to 3K motor units with decreased recruitment.  2+ fibrillations and positive waves were seen. The lumbosacral paraspinal muscles were tested at 3 levels, and revealed no abnormalities of insertional activity at all 3 levels tested. There was good relaxation.   IMPRESSION:  Nerve conduction studies done on both lower extremities shows evidence of dysfunction of the right peroneal and posterior tibial nerves.  EMG evaluation of the right lower extremity shows acute denervation that is severe in the peroneal nerve distribution muscles, but acute denervation is also seen in the right hamstring muscles and right gluteus medius muscle.  There is sparing of the lumbosacral paraspinal muscles.  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   Marlan Palau MD 10/11/2019 4:08 PM  [facility] [address] [place], [place] [address]  Phone [phone] Fax [phone]

## 2019-10-11 NOTE — Progress Notes (Addendum)
The patient comes in today for EMG and nerve conduction study evaluation.  The nerve conductions show some abnormalities of the peroneal and posterior tibial nerves, EMG shows denervation in the peroneal nerve distribution, hamstring muscles, and gluteus medius.  The study suggests the possibility of a lumbosacral plexopathy.  MRI of the lumbar spine did not show evidence of L5 or S1 nerve root impingement.  Lumbosacral paraspinal muscles on EMG were within normal limits.  The patient will be sent back for MRI of the pelvis.   The patient reports some swelling in the right leg, venous Doppler study was ordered.  Upon further evaluation, no swelling was seen today, the patient reports dependent swelling and purple discoloration that goes away when she elevates the leg.  I have canceled the venous Doppler study.    MNC    Nerve / Sites Muscle Latency Ref. Amplitude Ref. Rel Amp Segments Distance Velocity Ref. Area    ms ms mV mV %  cm m/s m/s mVms  R Peroneal - EDB     Ankle EDB NR ?6.5 NR ?2.0 NR Ankle - EDB 9   NR     Fib head EDB NR  NR  NR Fib head - Ankle 24 NR ?44 NR  L Peroneal - EDB     Ankle EDB 3.8 ?6.5 4.5 ?2.0 100 Ankle - EDB 9   14.6     Fib head EDB 8.7  4.3  96.2 Fib head - Ankle 25 51 ?44 14.2     Pop fossa EDB 10.6  4.4  102 Pop fossa - Fib head 10 53 ?44 14.2         Pop fossa - Ankle      R Tibial - AH     Ankle AH 3.7 ?5.8 0.3 ?4.0 100 Ankle - AH 9   1.6     Pop fossa AH 13.5  0.1  31.4 Pop fossa - Ankle 33 34 ?41 0.6  L Tibial - AH     Ankle AH 3.4 ?5.8 4.8 ?4.0 100 Ankle - AH 9   10.2     Pop fossa AH 10.3  2.5  52.7 Pop fossa - Ankle 32 46 ?41 12.0             SNC    Nerve / Sites Rec. Site Peak Lat Ref.  Amp Ref. Segments Distance    ms ms V V  cm  R Sural - Ankle (Calf)     Calf Ankle 3.3 ?4.4 6 ?6 Calf - Ankle 14  L Sural - Ankle (Calf)     Calf Ankle 3.4 ?4.4 7 ?6 Calf - Ankle 14  R Superficial peroneal - Ankle     Lat leg Ankle NR ?4.4 NR ?6 Lat  leg - Ankle 14  L Superficial peroneal - Ankle     Lat leg Ankle 3.6 ?4.4 9 ?6 Lat leg - Ankle 14              F  Wave    Nerve F Lat Ref.   ms ms  R Tibial - AH NR ?56.0  L Tibial - AH 44.7 ?56.0

## 2019-10-12 ENCOUNTER — Telehealth: Payer: Self-pay | Admitting: Neurology

## 2019-10-12 DIAGNOSIS — M21371 Foot drop, right foot: Secondary | ICD-10-CM | POA: Diagnosis not present

## 2019-10-12 NOTE — Telephone Encounter (Signed)
[employer] Auth: [id number] (exp. 10/12/19 to 04/08/20) order sent to GI. They will reach out to the patient to schedule.

## 2019-10-14 ENCOUNTER — Ambulatory Visit
Admission: RE | Admit: 2019-10-14 | Discharge: 2019-10-14 | Disposition: A | Discharge status: Home / Self Care | Payer: BC Managed Care – PPO | Source: Ambulatory Visit | Attending: Neurology | Admitting: Neurology

## 2019-10-14 DIAGNOSIS — K5730 Diverticulosis of large intestine without perforation or abscess without bleeding: Secondary | ICD-10-CM | POA: Diagnosis not present

## 2019-10-14 DIAGNOSIS — G541 Lumbosacral plexus disorders: Secondary | ICD-10-CM

## 2019-10-14 DIAGNOSIS — M6258 Muscle wasting and atrophy, not elsewhere classified, other site: Secondary | ICD-10-CM | POA: Diagnosis not present

## 2019-10-14 DIAGNOSIS — M79604 Pain in right leg: Secondary | ICD-10-CM | POA: Diagnosis not present

## 2019-10-14 DIAGNOSIS — M79671 Pain in right foot: Secondary | ICD-10-CM | POA: Diagnosis not present

## 2019-10-23 ENCOUNTER — Encounter: Payer: BC Managed Care – PPO | Admitting: Neurology

## 2019-10-25 ENCOUNTER — Telehealth: Payer: Self-pay | Admitting: Neurology

## 2019-10-25 DIAGNOSIS — G541 Lumbosacral plexus disorders: Secondary | ICD-10-CM

## 2019-10-25 DIAGNOSIS — E538 Deficiency of other specified B group vitamins: Secondary | ICD-10-CM

## 2019-10-25 NOTE — Telephone Encounter (Signed)
Pt called, Having pain shooting down right leg, foot stays swollen. Pt would like a call from the nurse to see if Dr. Anne Hahn can work in this week.

## 2019-10-25 NOTE — Telephone Encounter (Signed)
I called the patient.  She is still having her usual pain down the right leg, EMG has shown evidence of a lumbosacral plexopathy.  MRI of the pelvis was ordered and done on 14 October 2019, it apparently has not yet been read.  We will need to contact [facility] imaging to get the MRI read and the report sent to me.

## 2019-10-26 MED ORDER — PREDNISONE 5 MG PO TABS: ORAL_TABLET | ORAL | 0 refills | Status: AC

## 2019-10-26 MED ORDER — GABAPENTIN 600 MG PO TABS: 600.0000 mg | ORAL_TABLET | Freq: Three times a day (TID) | ORAL | 1 refills | Status: DC

## 2019-10-26 NOTE — Telephone Encounter (Signed)
Called [facility] imaging yesterday, and this morning, still waiting on results to be faxed in, Will call again today, if needed

## 2019-10-26 NOTE — Telephone Encounter (Signed)
Pt has called back and the most recent message at 2:35 was relayed to her.  Pt will wait to be called and asked that it be noted that she is in pain.

## 2019-10-26 NOTE — Addendum Note (Signed)
Addended by: York Spaniel on: 10/26/2019 05:26 PM   Modules accepted: Orders

## 2019-10-26 NOTE — Telephone Encounter (Signed)
Pt called wanting to know the update on these results. Pt states she is in a lot of pain. Please advise.

## 2019-10-26 NOTE — Telephone Encounter (Signed)
Pt has called asking if [facility] has been contacted as of yet.  Pt advised Candace,CMA is with patients but as soon as she is available to do so she will reach out to them.  Pt has asked that it be noted she had to cry all night due to pain.

## 2019-10-26 NOTE — Telephone Encounter (Signed)
Fyi  Called imaging again at [phone])  Tech states she is unaware why imaging has not been read yet  but she changed status to STAT  Results should come in soon   - informed her pt has called twice today for results

## 2019-10-26 NOTE — Telephone Encounter (Signed)
I called the patient.  The MRI of the pelvis has not yet been read, I have called [facility] again to make sure they get one of the radiologist to read this.  I will call in a prescription for prednisone over the next 12 days, the patient will increase the gabapentin to 600 mg 3 times daily.

## 2019-10-27 NOTE — Addendum Note (Signed)
Addended by: York Spaniel on: 10/27/2019 09:22 AM   Modules accepted: Orders

## 2019-10-27 NOTE — Telephone Encounter (Signed)
I called the patient and discussed the MRI result. No compressive lesions, we will get further blood work.

## 2019-10-30 ENCOUNTER — Other Ambulatory Visit: Payer: Self-pay

## 2019-10-30 ENCOUNTER — Other Ambulatory Visit (INDEPENDENT_AMBULATORY_CARE_PROVIDER_SITE_OTHER): Payer: Self-pay

## 2019-10-30 DIAGNOSIS — G541 Lumbosacral plexus disorders: Secondary | ICD-10-CM

## 2019-10-30 DIAGNOSIS — E538 Deficiency of other specified B group vitamins: Secondary | ICD-10-CM | POA: Diagnosis not present

## 2019-10-30 DIAGNOSIS — Z0289 Encounter for other administrative examinations: Secondary | ICD-10-CM

## 2019-11-01 LAB — MULTIPLE MYELOMA PANEL, SERUM
Albumin SerPl Elph-Mcnc: 3.9 g/dL (ref 2.9–4.4)
Albumin/Glob SerPl: 1.4 (ref 0.7–1.7)
Alpha 1: 0.3 g/dL (ref 0.0–0.4)
Alpha2 Glob SerPl Elph-Mcnc: 0.7 g/dL (ref 0.4–1.0)
B-Globulin SerPl Elph-Mcnc: 1.2 g/dL (ref 0.7–1.3)
Gamma Glob SerPl Elph-Mcnc: 0.8 g/dL (ref 0.4–1.8)
Globulin, Total: 3 g/dL (ref 2.2–3.9)
IgA/Immunoglobulin A, Serum: 297 mg/dL (ref 64–422)
IgG (Immunoglobin G), Serum: 873 mg/dL (ref 586–1602)
IgM (Immunoglobulin M), Srm: 84 mg/dL (ref 26–217)
Total Protein: 6.9 g/dL (ref 6.0–8.5)

## 2019-11-01 LAB — HEMOGLOBIN A1C
Est. average glucose Bld gHb Est-mCnc: 108 mg/dL
Hgb A1c MFr Bld: 5.4 % (ref 4.8–5.6)

## 2019-11-01 LAB — PAN-ANCA
ANCA Proteinase 3: <3.5 U/mL (ref 0.0–3.5)
Atypical pANCA: <1:20 {titer}
C-ANCA: <1:20 {titer}
Myeloperoxidase Ab: <9 U/mL (ref 0.0–9.0)
P-ANCA: <1:20 {titer}

## 2019-11-01 LAB — VITAMIN B12: Vitamin B-12: 409 pg/mL (ref 232–1245)

## 2019-11-01 LAB — ANGIOTENSIN CONVERTING ENZYME: Angio Convert Enzyme: 9 U/L — ABNORMAL LOW (ref 14–82)

## 2019-11-01 LAB — ANA W/REFLEX: Anti Nuclear Antibody (ANA): NEGATIVE

## 2019-11-01 LAB — HEPATITIS C ANTIBODY: Hep C Virus Ab: <0.1 s/co ratio (ref 0.0–0.9)

## 2019-11-01 LAB — SEDIMENTATION RATE: Sed Rate: 2 mm/hr (ref 0–40)

## 2019-11-01 LAB — B. BURGDORFI ANTIBODIES: Lyme IgG/IgM Ab: <0.91 {ISR} (ref 0.00–0.90)

## 2019-11-02 ENCOUNTER — Telehealth: Payer: Self-pay

## 2019-11-02 NOTE — Telephone Encounter (Signed)
Pt verified by name and DOB,  results given per provider  Pt states she is still experiencing pain, would like to know what you recommend

## 2019-11-02 NOTE — Telephone Encounter (Signed)
I called the patient.  The blood work was unremarkable, no definite source of lumbosacral plexopathy is noted.  The literature indicates that there is no specific treatment for this, immunotherapy has not been shown to be of significant benefit, may be some evidence that the use of steroids may shorten the duration of disability.  The patient is on a prednisone Dosepak currently, she believes that this has helped her pain some.  We may consider a longer duration lower dose prednisone therapy in the future.  If the pain is significant, we may add carbamazepine to the gabapentin.  The patient has a foot drop, she does have an AFO brace for this.

## 2020-03-05 DIAGNOSIS — U071 COVID-19: Secondary | ICD-10-CM | POA: Diagnosis not present

## 2020-03-28 DIAGNOSIS — S8290XD Unspecified fracture of unspecified lower leg, subsequent encounter for closed fracture with routine healing: Secondary | ICD-10-CM | POA: Diagnosis not present

## 2020-03-28 DIAGNOSIS — M21371 Foot drop, right foot: Secondary | ICD-10-CM | POA: Diagnosis not present

## 2020-03-28 DIAGNOSIS — Z1231 Encounter for screening mammogram for malignant neoplasm of breast: Secondary | ICD-10-CM | POA: Diagnosis not present

## 2020-03-28 DIAGNOSIS — Z0000 Encounter for general adult medical examination without abnormal findings: Secondary | ICD-10-CM | POA: Diagnosis not present

## 2020-04-11 DIAGNOSIS — S8291XD Unspecified fracture of right lower leg, subsequent encounter for closed fracture with routine healing: Secondary | ICD-10-CM | POA: Diagnosis not present

## 2020-04-15 DIAGNOSIS — M79671 Pain in right foot: Secondary | ICD-10-CM | POA: Diagnosis not present

## 2020-04-15 DIAGNOSIS — M25571 Pain in right ankle and joints of right foot: Secondary | ICD-10-CM | POA: Diagnosis not present

## 2020-04-15 DIAGNOSIS — M21371 Foot drop, right foot: Secondary | ICD-10-CM | POA: Diagnosis not present

## 2020-05-10 DIAGNOSIS — M21371 Foot drop, right foot: Secondary | ICD-10-CM | POA: Diagnosis not present

## 2020-05-22 DIAGNOSIS — M21371 Foot drop, right foot: Secondary | ICD-10-CM | POA: Diagnosis not present

## 2020-05-28 ENCOUNTER — Other Ambulatory Visit: Payer: Self-pay | Admitting: Neurology

## 2020-09-02 ENCOUNTER — Other Ambulatory Visit: Payer: Self-pay

## 2020-09-02 MED ORDER — GABAPENTIN 600 MG PO TABS: ORAL_TABLET | ORAL | 0 refills | Status: AC

## 2020-09-03 DIAGNOSIS — S8411XA Injury of peroneal nerve at lower leg level, right leg, initial encounter: Secondary | ICD-10-CM | POA: Diagnosis not present

## 2020-10-17 DIAGNOSIS — E663 Overweight: Secondary | ICD-10-CM | POA: Diagnosis not present

## 2020-10-17 DIAGNOSIS — H43393 Other vitreous opacities, bilateral: Secondary | ICD-10-CM | POA: Diagnosis not present

## 2020-10-17 DIAGNOSIS — H25813 Combined forms of age-related cataract, bilateral: Secondary | ICD-10-CM | POA: Diagnosis not present

## 2020-10-17 DIAGNOSIS — H538 Other visual disturbances: Secondary | ICD-10-CM | POA: Diagnosis not present

## 2020-10-17 DIAGNOSIS — M21371 Foot drop, right foot: Secondary | ICD-10-CM | POA: Diagnosis not present

## 2020-10-17 DIAGNOSIS — I10 Essential (primary) hypertension: Secondary | ICD-10-CM | POA: Diagnosis not present

## 2020-11-07 DIAGNOSIS — I10 Essential (primary) hypertension: Secondary | ICD-10-CM | POA: Diagnosis not present

## 2020-11-08 DIAGNOSIS — I10 Essential (primary) hypertension: Secondary | ICD-10-CM | POA: Diagnosis not present

## 2020-12-09 DIAGNOSIS — I10 Essential (primary) hypertension: Secondary | ICD-10-CM | POA: Diagnosis not present

## 2021-01-08 DIAGNOSIS — I10 Essential (primary) hypertension: Secondary | ICD-10-CM | POA: Diagnosis not present

## 2021-01-17 DIAGNOSIS — S93601A Unspecified sprain of right foot, initial encounter: Secondary | ICD-10-CM | POA: Diagnosis not present

## 2021-02-08 DIAGNOSIS — I10 Essential (primary) hypertension: Secondary | ICD-10-CM | POA: Diagnosis not present

## 2021-03-06 DIAGNOSIS — L03211 Cellulitis of face: Secondary | ICD-10-CM | POA: Diagnosis not present

## 2021-03-06 DIAGNOSIS — S8411XA Injury of peroneal nerve at lower leg level, right leg, initial encounter: Secondary | ICD-10-CM | POA: Diagnosis not present

## 2021-03-10 DIAGNOSIS — I10 Essential (primary) hypertension: Secondary | ICD-10-CM | POA: Diagnosis not present

## 2021-03-11 DIAGNOSIS — I10 Essential (primary) hypertension: Secondary | ICD-10-CM | POA: Diagnosis not present

## 2021-03-18 DIAGNOSIS — Z1231 Encounter for screening mammogram for malignant neoplasm of breast: Secondary | ICD-10-CM | POA: Diagnosis not present

## 2021-03-18 DIAGNOSIS — K2100 Gastro-esophageal reflux disease with esophagitis, without bleeding: Secondary | ICD-10-CM | POA: Diagnosis not present

## 2021-04-08 DIAGNOSIS — I10 Essential (primary) hypertension: Secondary | ICD-10-CM | POA: Diagnosis not present

## 2021-04-09 DIAGNOSIS — I10 Essential (primary) hypertension: Secondary | ICD-10-CM | POA: Diagnosis not present

## 2021-04-25 DIAGNOSIS — B029 Zoster without complications: Secondary | ICD-10-CM | POA: Diagnosis not present

## 2021-04-29 DIAGNOSIS — S0990XA Unspecified injury of head, initial encounter: Secondary | ICD-10-CM | POA: Diagnosis not present

## 2021-05-05 DIAGNOSIS — M549 Dorsalgia, unspecified: Secondary | ICD-10-CM | POA: Diagnosis not present

## 2021-05-11 IMAGING — MR MR PELVIS W/O CM
4 of 5 series · 30 of 48 positions shown · non-contrast
Comparison: None.

CLINICAL DATA: Right leg pain with foot drop. Lumbosacral
plexopathy. No known injury.

EXAM:
MRI PELVIS WITHOUT CONTRAST
TECHNIQUE: Multiplanar multisequence MR imaging of the pelvis was performed. No
intravenous contrast was administered.

[Series 3: T2 fat-sat · sagittal · 4.0mm · 0.47mm/px · 9 of 60 slices shown]
[im 1/60]
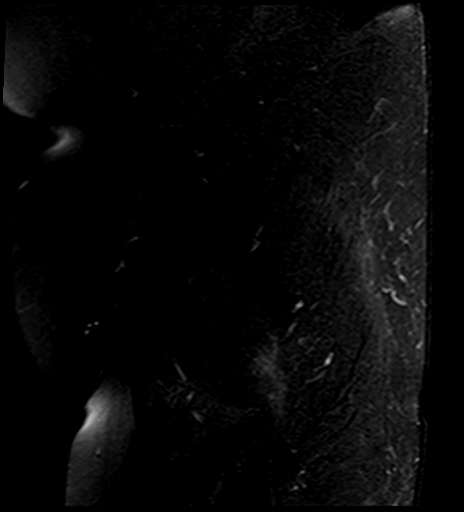
[im 11/60]
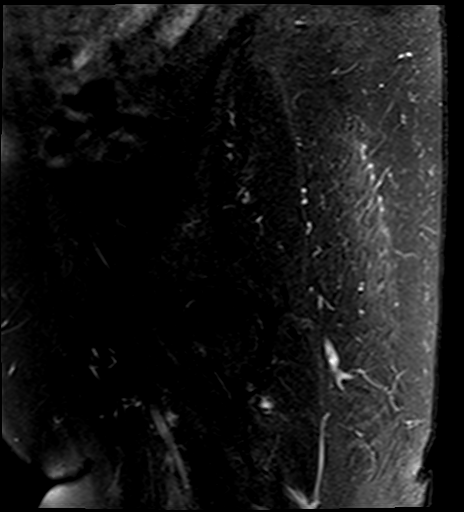
[im 17/60]
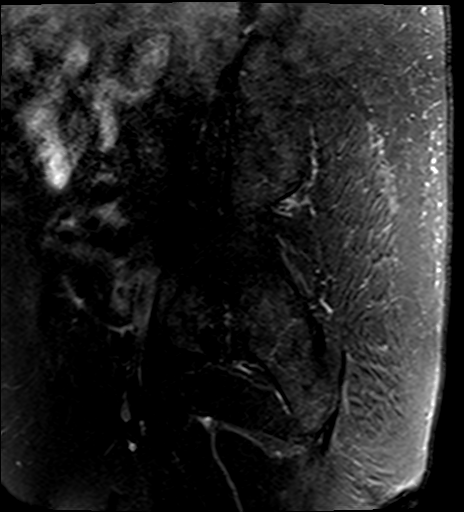
[im 27/60]
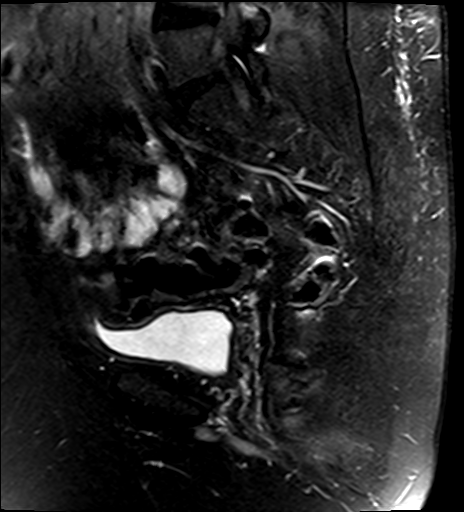
[im 33/60]
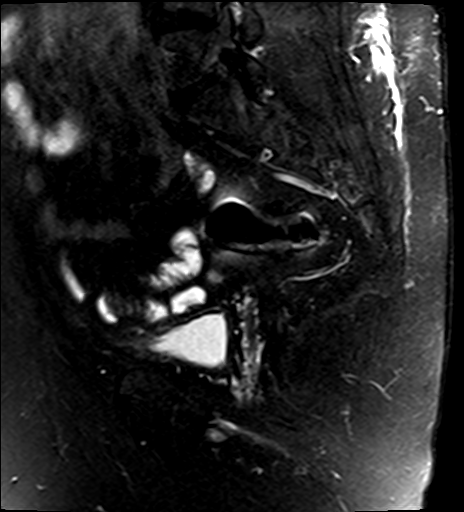
[im 43/60]
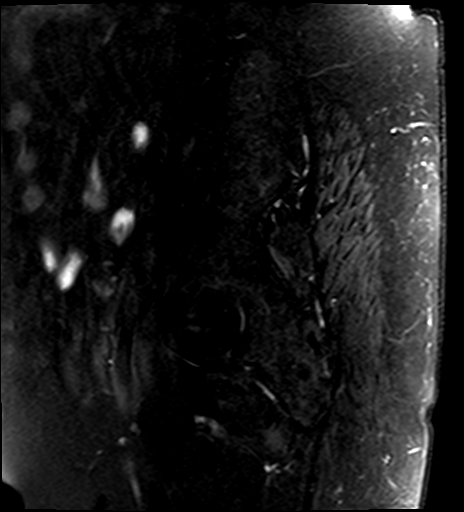
[im 49/60]
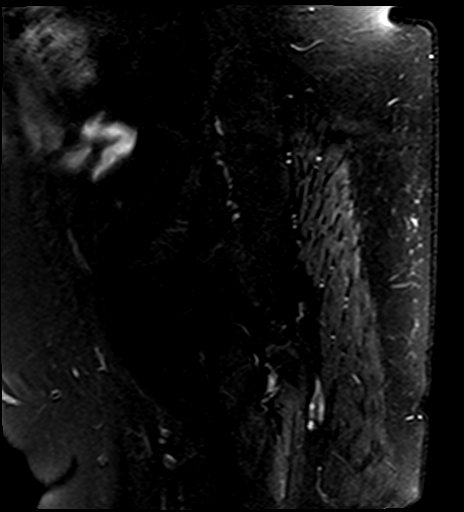
[im 54/60]
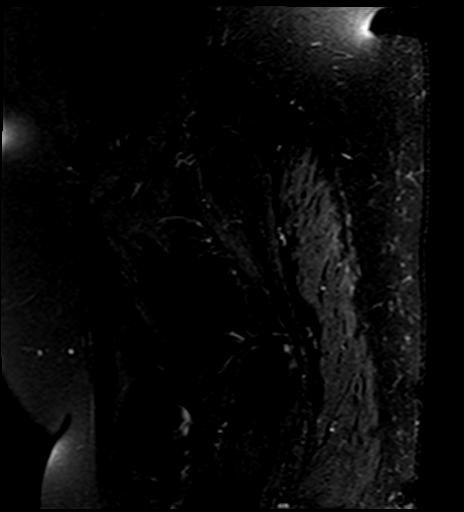
[im 60/60]
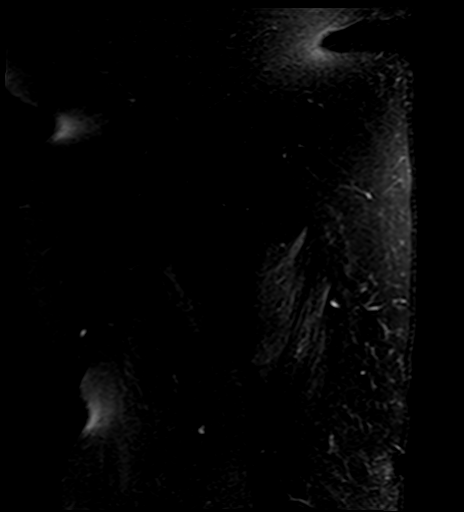

[Series 4: T1 · coronal · 4.0mm · 1.56mm/px · 8 of 36 slices shown (1 of 2)]
[im 1/36]
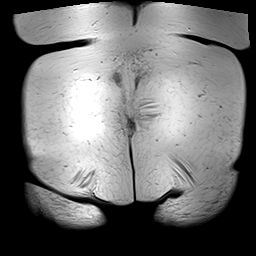
[im 6/36]
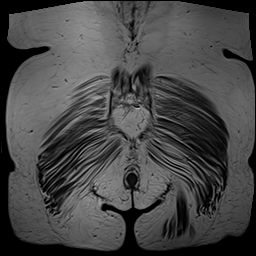
[im 11/36]
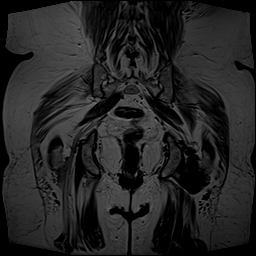
[im 16/36]
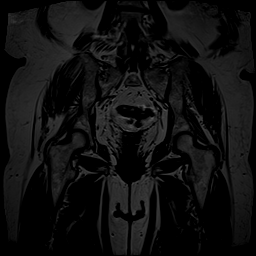
[im 21/36]
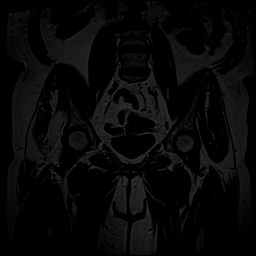
[im 26/36]
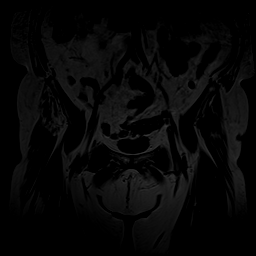
[im 31/36]
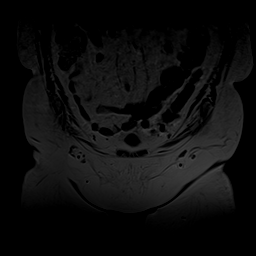
[im 36/36]
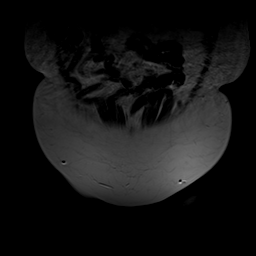

[Series 5: STIR · coronal · 4.0mm · 1.56mm/px · 8 of 36 slices shown]
[im 1/36]
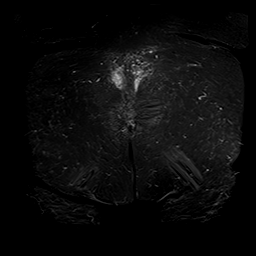
[im 6/36]
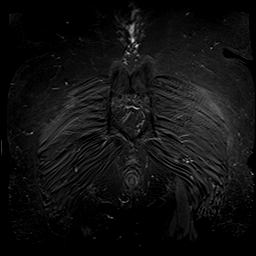
[im 11/36]
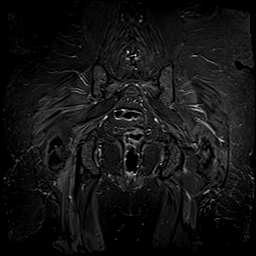
[im 16/36]
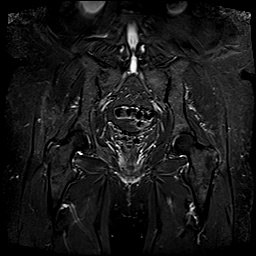
[im 21/36]
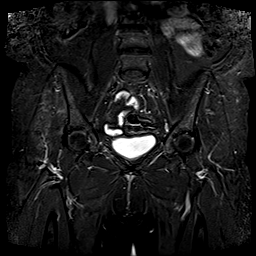
[im 26/36]
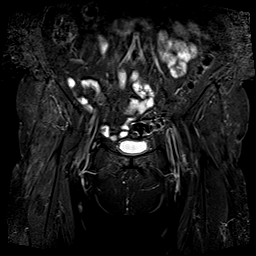
[im 31/36]
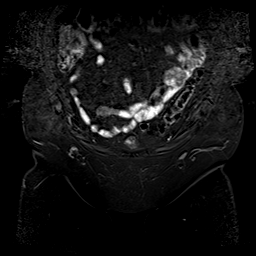
[im 36/36]
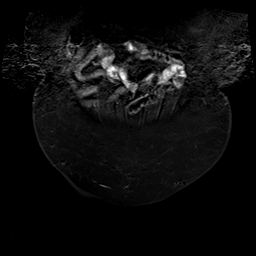

[Series 6: T1 · axial · 4.0mm · 0.64mm/px · z∈[-125,+79]mm · 5 of 48 slices shown (2 of 2)]
[im 1/48]
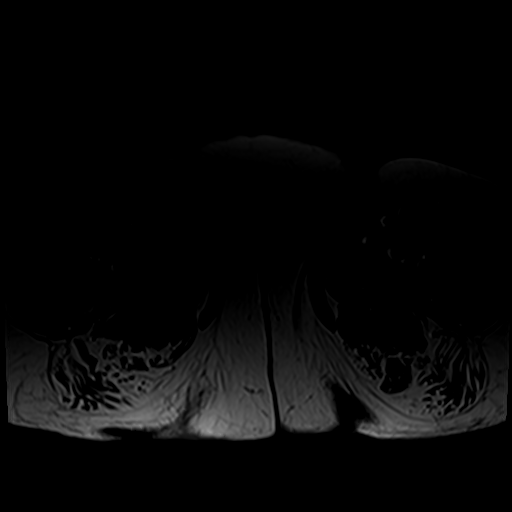
[im 6/48]
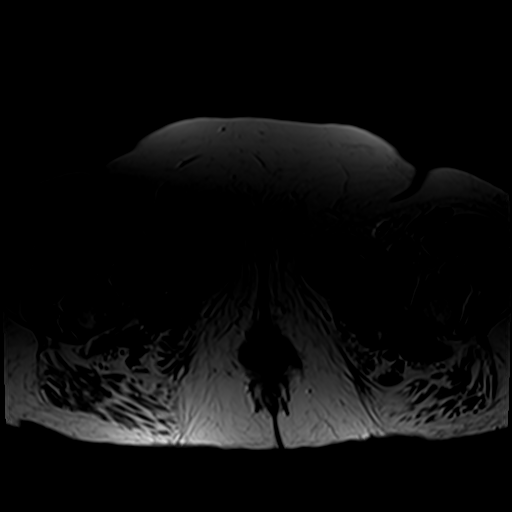
[im 16/48]
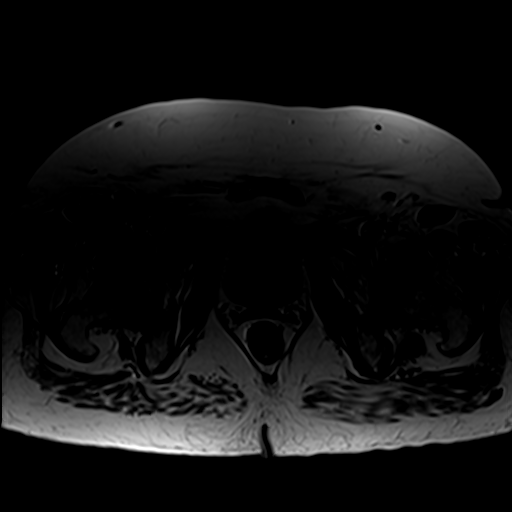
[im 27/48]
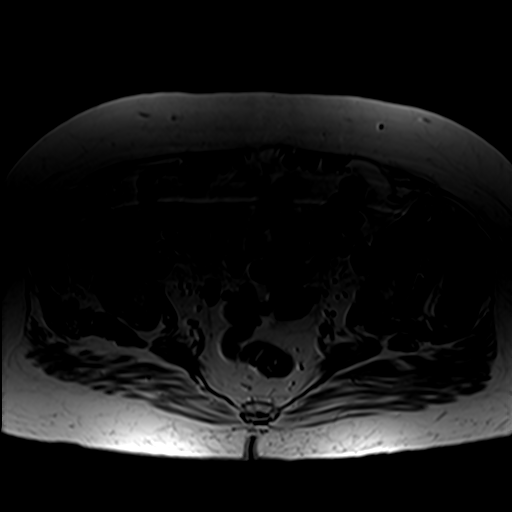
[im 42/48]
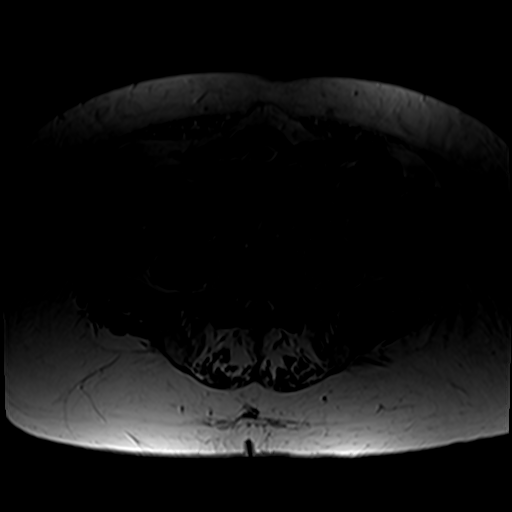

[30 of 48 positions shown; findings below may reference images not displayed]

FINDINGS: Interpretation of this examination was delayed as it was not made
available for dictation in PACS until 10/27/2019. Exam was
originally ear marked "[[id number]] to read by mistake.

Urinary Tract: The visualized distal ureters and bladder appear
unremarkable.

Bowel: No bowel wall thickening, distention or surrounding
inflammation identified within the pelvis. There are moderate
diverticular changes throughout the sigmoid colon.

Vascular/Lymphatic: No enlarged pelvic lymph nodes identified. No
significant vascular findings.

Reproductive: Hysterectomy.  No adnexal mass.

Other: The visualized abdominal wall is intact.  No pelvic ascites.

Musculoskeletal: No acute osseous findings or significant
arthropathic changes at the hips or sacroiliac joints. Mild lumbar
spondylosis. The sacrum appears normal. No abnormality of the
lumbosacral plexus identified. There is advanced asymmetric atrophy
of the right quadratus femoris muscle with associated T2
hyperintensity. There is also milder atrophy of the right gluteus
muscle without abnormal T2 signal. No other focal muscular atrophy
identified within the pelvis or proximal thighs. The piriformis
muscles appear symmetric. There is mild tendinosis of the gluteus
tendons bilaterally, but no tear. The iliopsoas and hamstring
tendons are intact.
IMPRESSION: 1. Asymmetric atrophy of the right quadratus femoris muscle with
associated T2 hyperintensity, suggesting ischiofemoral impingement
with associated subacute denervation.
2. Mild gluteus tendinosis bilaterally and asymmetric atrophy of the
right gluteus muscles.
3. No acute osseous findings or significant arthropathic changes at
the hips or sacroiliac joints. No abnormality of the lumbosacral
plexus identified.
4. Moderate sigmoid diverticulosis.
